# Patient Record
Sex: Female | Born: 1970
Health system: Southern US, Community
[De-identification: ages and names within clinical notes are randomized; demographics above are authoritative.]

## PROBLEM LIST (undated history)

## (undated) DIAGNOSIS — J387 Other diseases of larynx: Secondary | ICD-10-CM

## (undated) HISTORY — PX: ABDOMINAL HYSTERECTOMY: SHX81

## (undated) HISTORY — PX: LEEP: SHX91

## (undated) HISTORY — PX: DILATION AND CURETTAGE OF UTERUS: SHX78

## (undated) HISTORY — PX: APPENDECTOMY: SHX54

## (undated) HISTORY — PX: CHOLECYSTECTOMY: SHX55

---

## 2017-03-31 ENCOUNTER — Ambulatory Visit (INDEPENDENT_AMBULATORY_CARE_PROVIDER_SITE_OTHER): Payer: Managed Care, Other (non HMO) | Admitting: Otolaryngology

## 2017-03-31 DIAGNOSIS — D380 Neoplasm of uncertain behavior of larynx: Secondary | ICD-10-CM

## 2017-03-31 DIAGNOSIS — R490 Dysphonia: Secondary | ICD-10-CM

## 2017-04-01 ENCOUNTER — Other Ambulatory Visit: Payer: Self-pay | Admitting: Otolaryngology

## 2017-04-02 ENCOUNTER — Other Ambulatory Visit: Payer: Self-pay

## 2017-04-02 ENCOUNTER — Encounter (HOSPITAL_BASED_OUTPATIENT_CLINIC_OR_DEPARTMENT_OTHER): Payer: Self-pay | Admitting: *Deleted

## 2017-04-08 ENCOUNTER — Other Ambulatory Visit: Payer: Self-pay

## 2017-04-08 ENCOUNTER — Ambulatory Visit (HOSPITAL_BASED_OUTPATIENT_CLINIC_OR_DEPARTMENT_OTHER): Payer: Managed Care, Other (non HMO) | Admitting: Certified Registered"

## 2017-04-08 ENCOUNTER — Ambulatory Visit (HOSPITAL_BASED_OUTPATIENT_CLINIC_OR_DEPARTMENT_OTHER)
Admission: RE | Admit: 2017-04-08 | Discharge: 2017-04-08 | Disposition: A | Discharge status: Home / Self Care | Payer: Managed Care, Other (non HMO) | Source: Ambulatory Visit | Attending: Otolaryngology | Admitting: Otolaryngology

## 2017-04-08 ENCOUNTER — Encounter (HOSPITAL_BASED_OUTPATIENT_CLINIC_OR_DEPARTMENT_OTHER)
Admission: RE | Disposition: A | Discharge status: Home / Self Care | Payer: Self-pay | Source: Ambulatory Visit | Attending: Otolaryngology

## 2017-04-08 ENCOUNTER — Encounter (HOSPITAL_BASED_OUTPATIENT_CLINIC_OR_DEPARTMENT_OTHER): Payer: Self-pay

## 2017-04-08 DIAGNOSIS — J387 Other diseases of larynx: Secondary | ICD-10-CM | POA: Diagnosis present

## 2017-04-08 DIAGNOSIS — F17210 Nicotine dependence, cigarettes, uncomplicated: Secondary | ICD-10-CM | POA: Insufficient documentation

## 2017-04-08 DIAGNOSIS — Z880 Allergy status to penicillin: Secondary | ICD-10-CM | POA: Diagnosis not present

## 2017-04-08 DIAGNOSIS — J358 Other chronic diseases of tonsils and adenoids: Secondary | ICD-10-CM | POA: Insufficient documentation

## 2017-04-08 DIAGNOSIS — D380 Neoplasm of uncertain behavior of larynx: Secondary | ICD-10-CM | POA: Diagnosis not present

## 2017-04-08 DIAGNOSIS — Z882 Allergy status to sulfonamides status: Secondary | ICD-10-CM | POA: Diagnosis not present

## 2017-04-08 HISTORY — PX: LARYNGOSCOPY: SHX5203

## 2017-04-08 HISTORY — DX: Other diseases of larynx: J38.7

## 2017-04-08 SURGERY — LARYNGOSCOPY
Anesthesia: General | Site: Throat

## 2017-04-08 MED ORDER — OXYCODONE HCL 5 MG PO TABS: 5.0000 mg | ORAL_TABLET | Freq: Once | ORAL | Status: DC | PRN

## 2017-04-08 MED ORDER — FENTANYL CITRATE (PF) 100 MCG/2ML IJ SOLN: 25.0000 ug | INTRAMUSCULAR | Status: DC | PRN

## 2017-04-08 MED ORDER — CLINDAMYCIN HCL 300 MG PO CAPS: 300.0000 mg | ORAL_CAPSULE | Freq: Three times a day (TID) | ORAL | 0 refills | Status: AC

## 2017-04-08 MED ORDER — PROPOFOL 10 MG/ML IV BOLUS
INTRAVENOUS | Status: DC | PRN
  Administered 2017-04-08: 200 mg via INTRAVENOUS

## 2017-04-08 MED ORDER — LIDOCAINE HCL (CARDIAC) 20 MG/ML IV SOLN
INTRAVENOUS | Status: DC | PRN
  Administered 2017-04-08: 50 mg via INTRAVENOUS

## 2017-04-08 MED ORDER — PROPOFOL 10 MG/ML IV BOLUS
INTRAVENOUS | Status: AC
  Filled 2017-04-08: qty 40

## 2017-04-08 MED ORDER — ONDANSETRON HCL 4 MG/2ML IJ SOLN
INTRAMUSCULAR | Status: DC | PRN
  Administered 2017-04-08: 4 mg via INTRAVENOUS

## 2017-04-08 MED ORDER — ACETAMINOPHEN 325 MG PO TABS
ORAL_TABLET | ORAL | Status: AC
  Filled 2017-04-08: qty 2

## 2017-04-08 MED ORDER — LACTATED RINGERS IV SOLN
INTRAVENOUS | Status: DC
  Administered 2017-04-08: 08:00:00 via INTRAVENOUS

## 2017-04-08 MED ORDER — CLINDAMYCIN PHOSPHATE 600 MG/50ML IV SOLN
INTRAVENOUS | Status: DC | PRN
  Administered 2017-04-08: 600 mg via INTRAVENOUS

## 2017-04-08 MED ORDER — ACETAMINOPHEN 325 MG PO TABS
650.0000 mg | ORAL_TABLET | Freq: Once | ORAL | Status: AC
  Administered 2017-04-08: 650 mg via ORAL

## 2017-04-08 MED ORDER — PROMETHAZINE HCL 25 MG/ML IJ SOLN: 6.2500 mg | INTRAMUSCULAR | Status: DC | PRN

## 2017-04-08 MED ORDER — SUCCINYLCHOLINE CHLORIDE 20 MG/ML IJ SOLN
INTRAMUSCULAR | Status: DC | PRN
  Administered 2017-04-08: 30 mg via INTRAVENOUS

## 2017-04-08 MED ORDER — OXYCODONE HCL 5 MG/5ML PO SOLN: 5.0000 mg | Freq: Once | ORAL | Status: DC | PRN

## 2017-04-08 MED ORDER — FENTANYL CITRATE (PF) 100 MCG/2ML IJ SOLN
50.0000 ug | INTRAMUSCULAR | Status: DC | PRN
  Administered 2017-04-08 (×2): 50 ug via INTRAVENOUS

## 2017-04-08 MED ORDER — MIDAZOLAM HCL 2 MG/2ML IJ SOLN
INTRAMUSCULAR | Status: AC
  Filled 2017-04-08: qty 2

## 2017-04-08 MED ORDER — MIDAZOLAM HCL 2 MG/2ML IJ SOLN
1.0000 mg | INTRAMUSCULAR | Status: DC | PRN
  Administered 2017-04-08: 1 mg via INTRAVENOUS

## 2017-04-08 MED ORDER — DEXAMETHASONE SODIUM PHOSPHATE 4 MG/ML IJ SOLN
INTRAMUSCULAR | Status: DC | PRN
  Administered 2017-04-08: 20 mg via INTRAVENOUS

## 2017-04-08 MED ORDER — NEOSTIGMINE METHYLSULFATE 10 MG/10ML IV SOLN
INTRAVENOUS | Status: DC | PRN
  Administered 2017-04-08: 4 mg via INTRAVENOUS

## 2017-04-08 MED ORDER — MEPERIDINE HCL 25 MG/ML IJ SOLN: 6.2500 mg | INTRAMUSCULAR | Status: DC | PRN

## 2017-04-08 MED ORDER — GLYCOPYRROLATE 0.2 MG/ML IJ SOLN
INTRAMUSCULAR | Status: DC | PRN
  Administered 2017-04-08: 0.6 mg via INTRAVENOUS
  Administered 2017-04-08: 0.2 mg via INTRAVENOUS

## 2017-04-08 MED ORDER — FENTANYL CITRATE (PF) 100 MCG/2ML IJ SOLN
INTRAMUSCULAR | Status: AC
  Filled 2017-04-08: qty 2

## 2017-04-08 MED ORDER — EPINEPHRINE PF 1 MG/ML IJ SOLN
INTRAMUSCULAR | Status: DC | PRN
  Administered 2017-04-08: 1 mg

## 2017-04-08 MED ORDER — ROCURONIUM BROMIDE 100 MG/10ML IV SOLN
INTRAVENOUS | Status: DC | PRN
  Administered 2017-04-08: 30 mg via INTRAVENOUS

## 2017-04-08 MED ORDER — SCOPOLAMINE 1 MG/3DAYS TD PT72: 1.0000 | MEDICATED_PATCH | Freq: Once | TRANSDERMAL | Status: DC | PRN

## 2017-04-08 SURGICAL SUPPLY — 26 items
CANISTER SUCT 1200ML W/VALVE (MISCELLANEOUS) ×3 IMPLANT
GAUZE SPONGE 4X4 12PLY STRL LF (GAUZE/BANDAGES/DRESSINGS) ×6 IMPLANT
GLOVE BIO SURGEON STRL SZ 6.5 (GLOVE) ×4 IMPLANT
GLOVE BIO SURGEON STRL SZ7.5 (GLOVE) ×6 IMPLANT
GLOVE BIO SURGEONS STRL SZ 6.5 (GLOVE) ×2
GOWN STRL REUS W/ TWL LRG LVL3 (GOWN DISPOSABLE) IMPLANT
GOWN STRL REUS W/ TWL XL LVL3 (GOWN DISPOSABLE) ×3 IMPLANT
GOWN STRL REUS W/TWL LRG LVL3 (GOWN DISPOSABLE)
GOWN STRL REUS W/TWL XL LVL3 (GOWN DISPOSABLE) ×6
GUARD TEETH (MISCELLANEOUS) IMPLANT
MARKER SKIN DUAL TIP RULER LAB (MISCELLANEOUS) IMPLANT
NEEDLE HYPO 18GX1.5 BLUNT FILL (NEEDLE) IMPLANT
NEEDLE SPNL 22GX7 QUINCKE BK (NEEDLE) IMPLANT
NEEDLE SPNL 25GX3.5 QUINCKE BL (NEEDLE) ×3 IMPLANT
NS IRRIG 1000ML POUR BTL (IV SOLUTION) ×3 IMPLANT
PACK BASIN DAY SURGERY FS (CUSTOM PROCEDURE TRAY) ×3 IMPLANT
PATTIES SURGICAL .5 X3 (DISPOSABLE) ×3 IMPLANT
SHEET MEDIUM DRAPE 40X70 STRL (DRAPES) ×3 IMPLANT
SLEEVE SCD COMPRESS KNEE MED (MISCELLANEOUS) IMPLANT
SOLUTION BUTLER CLEAR DIP (MISCELLANEOUS) ×3 IMPLANT
SURGILUBE 2OZ TUBE FLIPTOP (MISCELLANEOUS) IMPLANT
SYR CONTROL 10ML LL (SYRINGE) ×3 IMPLANT
SYR TB 1ML LL NO SAFETY (SYRINGE) ×3 IMPLANT
TOWEL OR 17X24 6PK STRL BLUE (TOWEL DISPOSABLE) ×3 IMPLANT
TUBE CONNECTING 20'X1/4 (TUBING) ×1
TUBE CONNECTING 20X1/4 (TUBING) ×2 IMPLANT

## 2017-04-08 NOTE — Anesthesia Procedure Notes (Signed)
Procedure Name: Intubation Performed by: Verita Lamb, CRNA Pre-anesthesia Checklist: Patient identified, Emergency Drugs available, Suction available, Patient being monitored and Timeout performed Patient Re-evaluated:Patient Re-evaluated prior to induction Preoxygenation: Pre-oxygenation with 100% oxygen Induction Type: IV induction Ventilation: Mask ventilation without difficulty Laryngoscope Size: Mac, 3 and Glidescope Grade View: Grade I Tube type: Oral Tube size: 7.0 mm Number of attempts: 1 Airway Equipment and Method: Stylet Placement Confirmation: ETT inserted through vocal cords under direct vision,  CO2 detector,  positive ETCO2 and breath sounds checked- equal and bilateral Secured at: 21 cm Tube secured with: Tape Dental Injury: Teeth and Oropharynx as per pre-operative assessment

## 2017-04-08 NOTE — Transfer of Care (Signed)
Immediate Anesthesia Transfer of Care Note  Patient: Brandy Powell  Procedure(s) Performed: LARYNGOSCOPY WITH EXCISION OF LARYNGEAL MASS (N/A Throat)  Patient Location: PACU  Anesthesia Type:General  Level of Consciousness: awake, alert  and oriented  Airway & Oxygen Therapy: Patient Spontanous Breathing and Patient connected to face mask oxygen  Post-op Assessment: Report given to RN and Post -op Vital signs reviewed and stable  Post vital signs: Reviewed and stable  Last Vitals:  Vitals:   04/08/17 0757  BP: 114/65  Pulse: 79  Resp: 18  Temp: 36.4 C  SpO2: 99%    Last Pain:  Vitals:   04/08/17 0757  TempSrc: Oral         Complications: No apparent anesthesia complications

## 2017-04-08 NOTE — H&P (Signed)
Cc: Laryngeal cyst  HPI: The patient is a 47 y/o female who presents today for evaluation of a laryngeal cyst. The patient is seen in consultation requested by [facility]. The patient recently underwent a gynecological procedure with a laryngeal cyst noted at intubation. The patient has noted intermittent hoarseness since that time. No dysphagia or odynophagia is noted. The patient is a 30+ pack year smoker. No previous ENT surgery is noted.   The patient's review of systems (constitutional, eyes, ENT, cardiovascular, respiratory, GI, musculoskeletal, skin, neurologic, psychiatric, endocrine, hematologic, allergic) is noted in the ROS questionnaire.  It is reviewed with the patient.   Family health history: Diabetes, heart disease.  Major events: Hysterectomy, appendectomy, gallbladder removed, D&C.  Ongoing medical problems: None.  Social history: The patient is single. She denies the use of alcohol or illegal drugs. She smoke 1-1.5 pack of cigarettes a day.  Exam General: Communicates without difficulty, well nourished, no acute distress. Head: Normocephalic, no evidence injury, no tenderness, facial buttresses intact without stepoff. Eyes: PERRL, EOMI.  No scleral icterus, conjunctivae clear. Ears: External auditory canals clear bilaterally.  There is no edema or erythema.  Tympanic membrane is within normal limits bilaterally. Nose: Normal skin and external support.  Anterior rhinoscopy reveals healthy pink mucosa over the septum and turbinates.  No lesions or polyps were seen. Oral cavity: Lips without lesions, oral mucosa moist, no masses or lesions seen. Indirect  mirror laryngoscopy could not be tolerated. Pharynx: Clear, no erythema. Neck: Supple, full range of motion, no lymphadenopathy, no masses palpable. Salivary: Parotid and submandibular glands without mass. Neuro:  CN 2-12 grossly intact. Gait normal. Vestibular: No nystagmus at any point of gaze.   Procedure:  Flexible  Fiberoptic Laryngoscopy -- Risks, benefits, and alternatives of flexible endoscopy were explained to the patient.  Specific mention was made of the risk of throat numbness with difficulty swallowing, possible bleeding from the nose and mouth, and pain from the procedure.  The patient gave oral consent to proceed.  The nasal cavities were decongested and anesthetised with a combination of oxymetazoline and 4% lidocaine solution.  The flexible scope was inserted into the right nasal cavity and advanced towards the nasopharynx.  Visualized mucosa over the turbinates and septum were as described above.  The nasopharynx was clear.  Oropharyngeal walls were symmetric and mobile without lesion, mass, or edema.  Hypopharynx was also without  lesion or edema.  Larynx was mobile. A cystic lesion noted at the right aryepiglottic fold.  Supraglottic structures were free of edema, mass, and asymmetry.  True vocal folds were white without mass or lesion.  Base of tongue was within normal limits.  The patient tolerated the procedure well.  Assessment A cystic lesion is noted at the right aryepiglottic fold.  The appearance is suggestive of a benign lesion. No other suspicious mass or lesion is noted on today's fiberoptic laryngoscopy exam.  Plan  1. Laryngoscopy findings are reviewed with the patient.  2. Recommend DL with excision of the mass. The risks, benefits, alternatives, and details of the procedure are reviewed with the patient. Questions are invited and answered. 3. The patient is interested in proceeding with the procedure.  We will schedule the procedure in accordance with the family schedule.

## 2017-04-08 NOTE — Anesthesia Postprocedure Evaluation (Signed)
Anesthesia Post Note  Patient: Brandy Powell  Procedure(s) Performed: LARYNGOSCOPY WITH EXCISION OF LARYNGEAL MASS (N/A Throat)     Patient location during evaluation: PACU Anesthesia Type: General Level of consciousness: sedated and patient cooperative Pain management: pain level controlled Vital Signs Assessment: post-procedure vital signs reviewed and stable Respiratory status: spontaneous breathing Cardiovascular status: stable Anesthetic complications: no    Last Vitals:  Vitals:   04/08/17 1102 04/08/17 1125  BP:  138/85  Pulse:    Resp: (!) 22 20  Temp:  37 C  SpO2:  98%    Last Pain:  Vitals:   04/08/17 1125  TempSrc:   PainSc: Ali Chuk

## 2017-04-08 NOTE — Anesthesia Preprocedure Evaluation (Signed)
Anesthesia Evaluation  Patient identified by MRN, date of birth, ID band Patient awake    Reviewed: Allergy & Precautions, NPO status , Patient's Chart, lab work & pertinent test results  Airway Mallampati: II  TM Distance: >3 FB Neck ROM: Full    Dental no notable dental hx.    Pulmonary neg pulmonary ROS, Current Smoker,    Pulmonary exam normal breath sounds clear to auscultation       Cardiovascular negative cardio ROS Normal cardiovascular exam Rhythm:Regular Rate:Normal     Neuro/Psych negative neurological ROS  negative psych ROS   GI/Hepatic negative GI ROS, Neg liver ROS,   Endo/Other  negative endocrine ROS  Renal/GU negative Renal ROS     Musculoskeletal negative musculoskeletal ROS (+)   Abdominal   Peds  Hematology negative hematology ROS (+)   Anesthesia Other Findings   Reproductive/Obstetrics negative OB ROS                             Anesthesia Physical Anesthesia Plan  ASA: II  Anesthesia Plan: General   Post-op Pain Management:    Induction: Intravenous  PONV Risk Score and Plan: 2 and Ondansetron and Dexamethasone  Airway Management Planned: Oral ETT  Additional Equipment:   Intra-op Plan:   Post-operative Plan: Extubation in OR  Informed Consent: I have reviewed the patients History and Physical, chart, labs and discussed the procedure including the risks, benefits and alternatives for the proposed anesthesia with the patient or authorized representative who has indicated his/her understanding and acceptance.   Dental advisory given  Plan Discussed with: CRNA  Anesthesia Plan Comments:         Anesthesia Quick Evaluation

## 2017-04-08 NOTE — Discharge Instructions (Addendum)
°  Post Anesthesia Home Care Instructions  Activity: Get plenty of rest for the remainder of the day. A responsible individual must stay with you for 24 hours following the procedure.  For the next 24 hours, DO NOT: -Drive a car -Paediatric nurse -Drink alcoholic beverages -Take any medication unless instructed by your physician -Make any legal decisions or sign important papers.  Meals: Start with liquid foods such as gelatin or soup. Progress to regular foods as tolerated. Avoid greasy, spicy, heavy foods. If nausea and/or vomiting occur, drink only clear liquids until the nausea and/or vomiting subsides. Call your physician if vomiting continues.  Special Instructions/Symptoms: Your throat may feel dry or sore from the anesthesia or the breathing tube placed in your throat during surgery. If this causes discomfort, gargle with warm salt water. The discomfort should disappear within 24 hours.  If you had a scopolamine patch placed behind your ear for the management of post- operative nausea and/or vomiting:  1. The medication in the patch is effective for 72 hours, after which it should be removed.  Wrap patch in a tissue and discard in the trash. Wash hands thoroughly with soap and water. 2. You may remove the patch earlier than 72 hours if you experience unpleasant side effects which may include dry mouth, dizziness or visual disturbances. 3. Avoid touching the patch. Wash your hands with soap and water after contact with the patch.   ----------------------  The patient may resume all her previous activities and diet. She will follow-up in my [facility] office in one week.

## 2017-04-08 NOTE — Op Note (Signed)
DATE OF PROCEDURE:  04/08/2017                              OPERATIVE REPORT  SURGEON:  Leta Baptist, MD  PREOPERATIVE DIAGNOSES: 1. Right supraglottic mass  POSTOPERATIVE DIAGNOSES: 1. Right supraglottic mass  PROCEDURE PERFORMED:  MicroDirect laryngoscopy with excision of right supraglottic mass (CPT 31541)  ANESTHESIA:  General endotracheal tube anesthesia.  COMPLICATIONS:  None.  ESTIMATED BLOOD LOSS:  Minimal.  INDICATION FOR PROCEDURE:  Brandy Powell is a 47 y.o. female who was recently noted to have a laryngeal mass during a gynecological procedure.  On her flexible laryngoscopy examination, a 1 cm cystic lesion was noted at the right aryepiglottic fold. Based on the above findings, the decision was made for the patient to undergo the above-stated procedure.  The risks, benefits, alternatives, and details of the procedure were discussed with the patient.  Questions were invited and answered.  Informed consent was obtained.  DESCRIPTION:  The patient was taken to the operating room and placed supine on the operating table.  General endotracheal tube anesthesia was administered by the anesthesiologist.  The patient was positioned and prepped and draped in a standard fashion for the right laryngoscopy.  A Dedo laryngoscope was inserted via the oral cavity into the pharynx. A large 1 cm cystic lesion was noted above the right aryepiglottic fold. The vallecula, epiglottis, piriform sinuses, and the vocal cords were otherwise normal. The Dedo laryngoscope was suspended with a Lewy suspender. A microscope was brought into the field. Under the all operating microscope, the laryngeal lesion was excised using a laryngeal cup forceps and laryngeal scissors. Hemostasis was achieved with pledgets soaked with epinephrine.  The care of the patient was turned over to the anesthesiologist.  The patient was awakened from anesthesia without difficulty.  The patient was extubated and transferred to the  recovery room in good condition.  OPERATIVE FINDINGS: A 1 cm right aryepiglottic fold mass.  SPECIMEN: Right aryepiglottic fold mass.  FOLLOWUP CARE:  The patient will be discharged home once awake and alert.  The patient will follow up in my office in 1 week.  Cindel Daugherty W Zani Kyllonen 04/08/2017 10:00 AM

## 2017-04-09 ENCOUNTER — Encounter (HOSPITAL_BASED_OUTPATIENT_CLINIC_OR_DEPARTMENT_OTHER): Payer: Self-pay | Admitting: Otolaryngology

## 2017-04-17 ENCOUNTER — Ambulatory Visit (INDEPENDENT_AMBULATORY_CARE_PROVIDER_SITE_OTHER): Payer: Managed Care, Other (non HMO) | Admitting: Otolaryngology

## 2017-04-21 ENCOUNTER — Ambulatory Visit (INDEPENDENT_AMBULATORY_CARE_PROVIDER_SITE_OTHER): Payer: Managed Care, Other (non HMO) | Admitting: Otolaryngology

## 2017-05-08 ENCOUNTER — Ambulatory Visit (INDEPENDENT_AMBULATORY_CARE_PROVIDER_SITE_OTHER): Payer: Managed Care, Other (non HMO) | Admitting: Otolaryngology

## 2017-05-08 DIAGNOSIS — D141 Benign neoplasm of larynx: Secondary | ICD-10-CM

## 2017-11-06 ENCOUNTER — Ambulatory Visit (INDEPENDENT_AMBULATORY_CARE_PROVIDER_SITE_OTHER): Payer: Managed Care, Other (non HMO) | Admitting: Otolaryngology

## 2020-03-22 ENCOUNTER — Other Ambulatory Visit: Payer: Self-pay | Admitting: Physician Assistant

## 2020-03-22 DIAGNOSIS — R921 Mammographic calcification found on diagnostic imaging of breast: Secondary | ICD-10-CM

## 2020-03-28 ENCOUNTER — Other Ambulatory Visit: Payer: Self-pay

## 2020-03-28 ENCOUNTER — Ambulatory Visit
Admission: RE | Admit: 2020-03-28 | Discharge: 2020-03-28 | Disposition: A | Discharge status: Home / Self Care | Payer: 59 | Source: Ambulatory Visit | Attending: Physician Assistant | Admitting: Physician Assistant

## 2020-03-28 DIAGNOSIS — R921 Mammographic calcification found on diagnostic imaging of breast: Secondary | ICD-10-CM

## 2021-10-03 IMAGING — MG MM BREAST BX W LOC DEV 1ST LESION IMAGE BX SPEC STEREO GUIDE*L*
7 of 13 series · 7 of 21 positions shown · non-contrast
Comparison: Previous exams.
COMPARISON: Previous exams.

Addendum:
CLINICAL DATA: Patient with 2 separate groups of indeterminate
calcifications in the upper LEFT breast presents today for
ultrasound-guided core biopsy

EXAM:
LEFT BREAST STEREOTACTIC CORE NEEDLE BIOPSY x2

[L (1 of 7)]
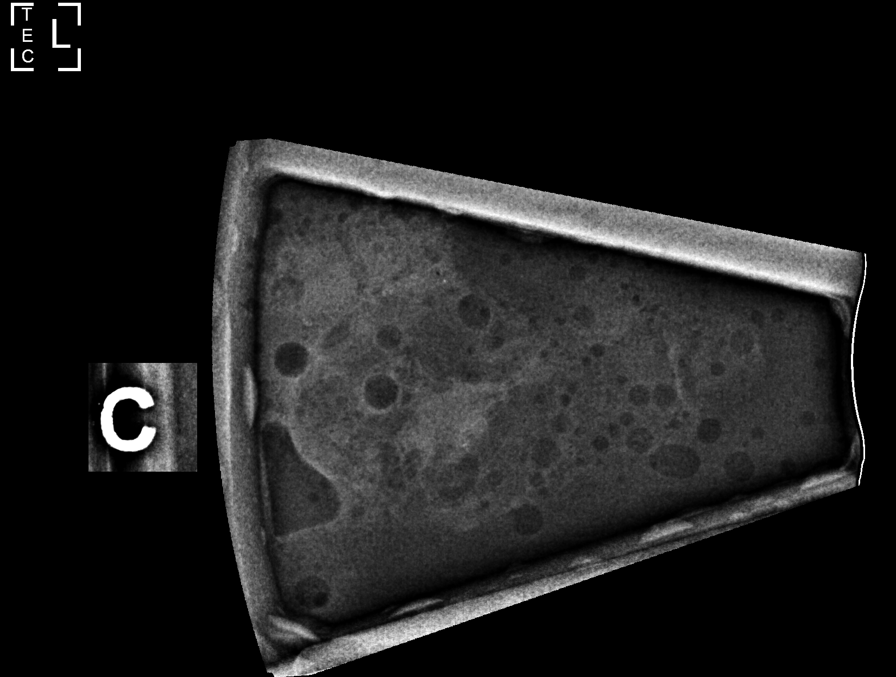

[L (2 of 7)]
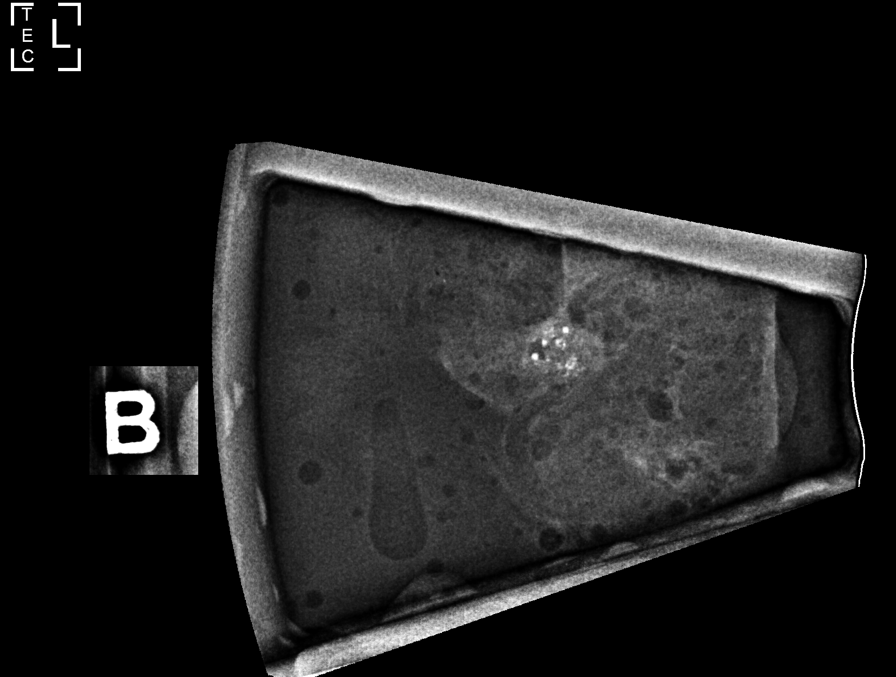

[L (3 of 7)]
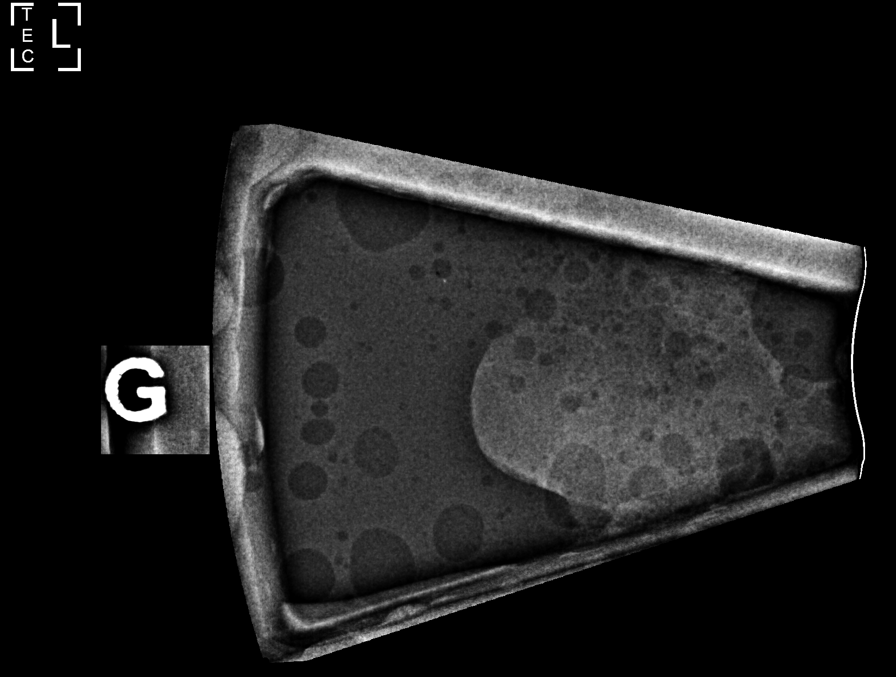

[L (4 of 7)]
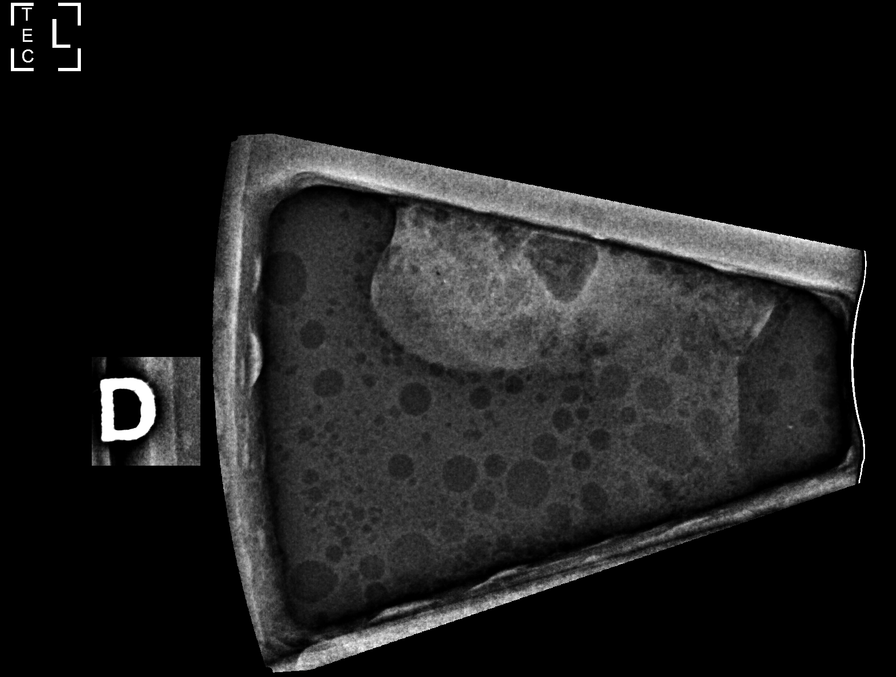

[L (5 of 7)]
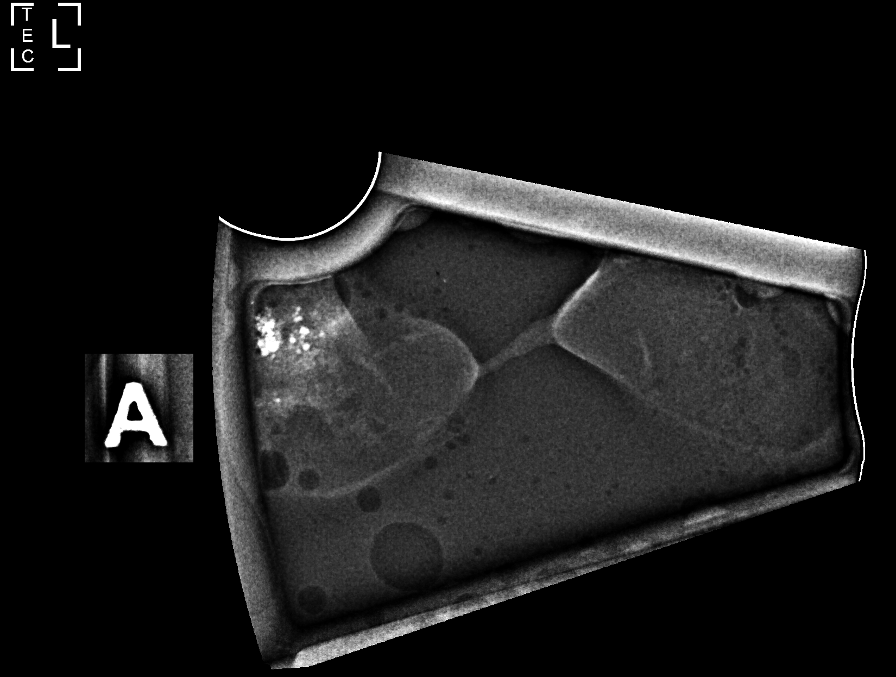

[L (6 of 7)]
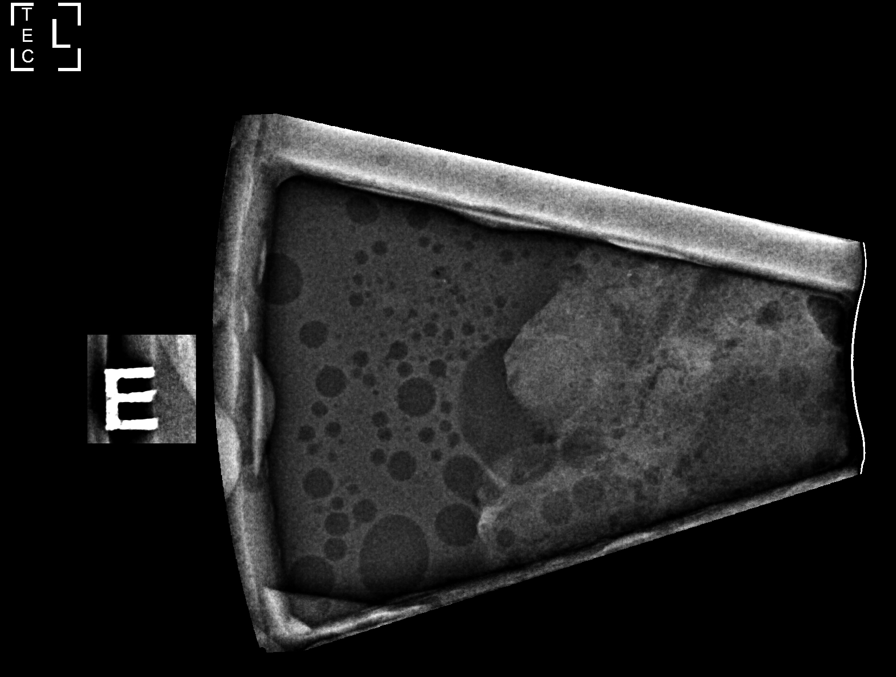

[L (7 of 7)]
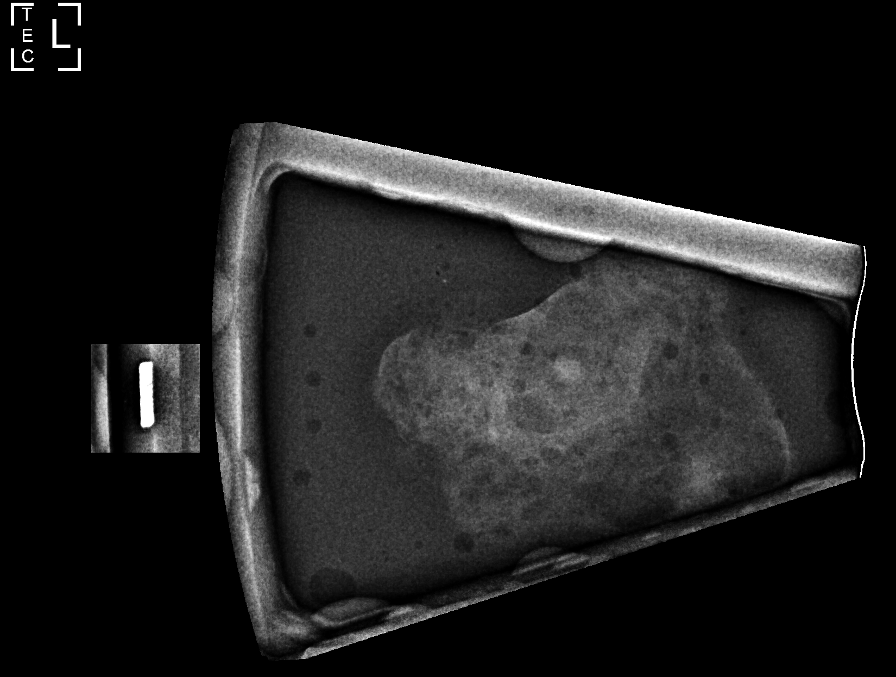

[7 of 21 positions shown; findings below may reference images not displayed]



Site 1:

Using sterile technique and 1% Lidocaine as local anesthetic, under
stereotactic guidance, a 9 gauge vacuum assisted device was used to
perform core needle biopsy of calcifications in the upper-outer
quadrant of the left breast using a superior approach. Specimen
radiograph was performed showing calcifications. Specimens with
calcifications are identified for pathology.

Lesion quadrant: Upper outer quadrant

At the conclusion of the procedure, coil shaped tissue marker clip
was deployed into the biopsy cavity.

Site 2:

Using sterile technique and 1% Lidocaine as local anesthetic, under
stereotactic guidance, a 9 gauge vacuum assisted device was used to
perform core needle biopsy of calcifications in the upper inner
quadrant of the left breast using a superior approach. Specimen
radiograph was performed showing calcifications. Specimens with
calcifications are identified for pathology.

Lesion quadrant: Upper inner quadrant

At the conclusion of the procedure, X shaped tissue marker clip was
deployed into the biopsy cavity.

Follow-up 2-view mammogram was performed and dictated separately.
IMPRESSION: 1. Stereotactic-guided biopsy of calcifications within the
upper-outer quadrant of the LEFT breast (coil shaped clip). No
apparent complications.
2. Stereotactic-guided biopsy of calcifications within the upper
inner quadrant of the LEFT breast (X shaped clip). No apparent
complications.

ADDENDUM:
Pathology revealed USUAL DUCTAL HYPERPLASIA, COLUMNAR CELL CHANGES
AND SCLEROSING ADENOSIS WITH CALCIFICATIONS of the LEFT breast,
upper outer. This was found to be concordant by Dr. Chayo Mccrea.

Pathology revealed USUAL DUCTAL HYPERPLASIA, COLUMNAR CELL CHANGES
AND SCLEROSING ADENOSIS WITH CALCIFICATIONS of the LEFT breast,
upper inner. This was found to be concordant by Dr. Chayo Mccrea.

Pathology results were discussed with the patient by telephone. The
patient reported doing well after the biopsies with tenderness at
the sites. Post biopsy instructions and care were reviewed and
questions were answered. The patient was encouraged to call The

The patient was instructed to return for annual screening
mammography at [HOSPITAL] [REDACTED] and informed a
reminder notice would be sent regarding this appointment.

Pathology results reported by Ribet Herrmann RN on 03/29/2020.



Site 1:

Using sterile technique and 1% Lidocaine as local anesthetic, under
stereotactic guidance, a 9 gauge vacuum assisted device was used to
perform core needle biopsy of calcifications in the upper-outer
quadrant of the left breast using a superior approach. Specimen
radiograph was performed showing calcifications. Specimens with
calcifications are identified for pathology.

Lesion quadrant: Upper outer quadrant

At the conclusion of the procedure, coil shaped tissue marker clip
was deployed into the biopsy cavity.

Site 2:

Using sterile technique and 1% Lidocaine as local anesthetic, under
stereotactic guidance, a 9 gauge vacuum assisted device was used to
perform core needle biopsy of calcifications in the upper inner
quadrant of the left breast using a superior approach. Specimen
radiograph was performed showing calcifications. Specimens with
calcifications are identified for pathology.

Lesion quadrant: Upper inner quadrant

At the conclusion of the procedure, X shaped tissue marker clip was
deployed into the biopsy cavity.

Follow-up 2-view mammogram was performed and dictated separately.
IMPRESSION: 1. Stereotactic-guided biopsy of calcifications within the
upper-outer quadrant of the LEFT breast (coil shaped clip). No
apparent complications.
2. Stereotactic-guided biopsy of calcifications within the upper
inner quadrant of the LEFT breast (X shaped clip). No apparent
complications.

## 2021-10-03 IMAGING — MG MM BREAST BX W LOC DEV EA AD LESION IMG BX SPEC STEREO GUIDE*L*
6 series · 7 of 22 positions shown · non-contrast
Comparison: Previous exams.
COMPARISON: Previous exams.

Addendum:
CLINICAL DATA: Patient with 2 separate groups of indeterminate
calcifications in the upper LEFT breast presents today for
ultrasound-guided core biopsy

EXAM:
LEFT BREAST STEREOTACTIC CORE NEEDLE BIOPSY x2

[L CC (1 of 2)]
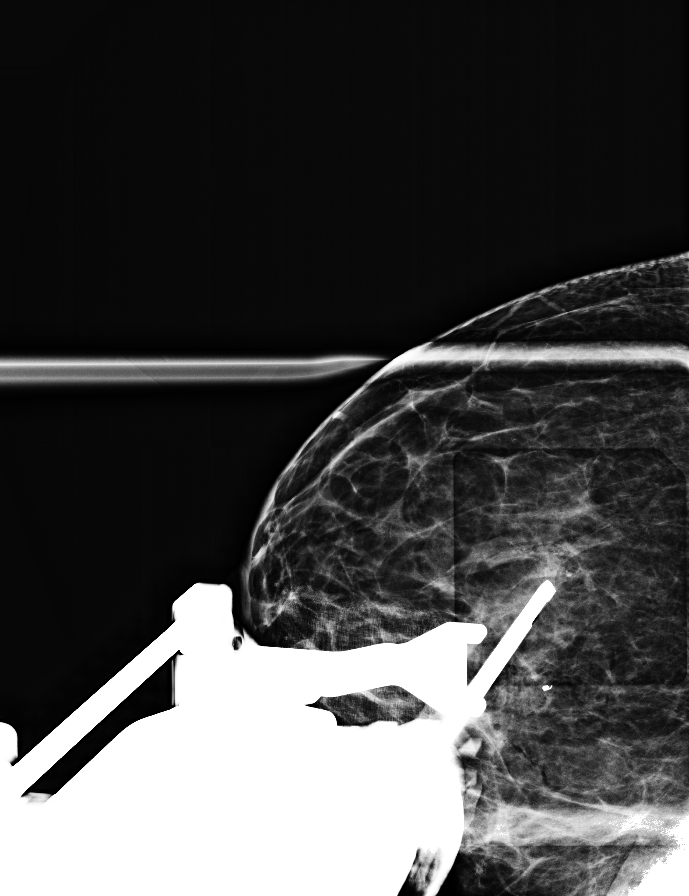

[L CC (2 of 2)]
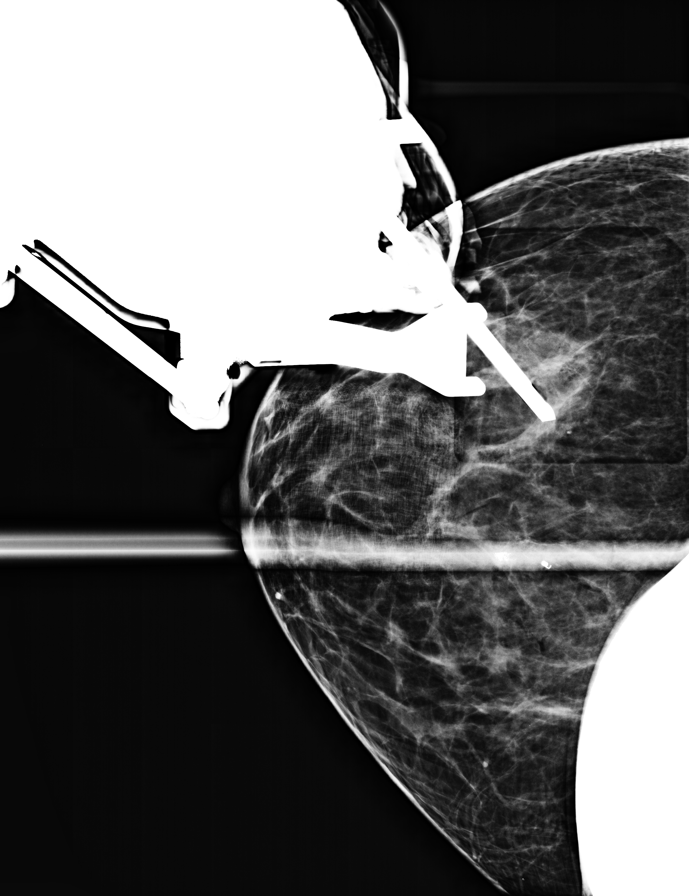

[L CC tomo · 2 of 75 frames shown (1 of 4)]
[frame 25/75]
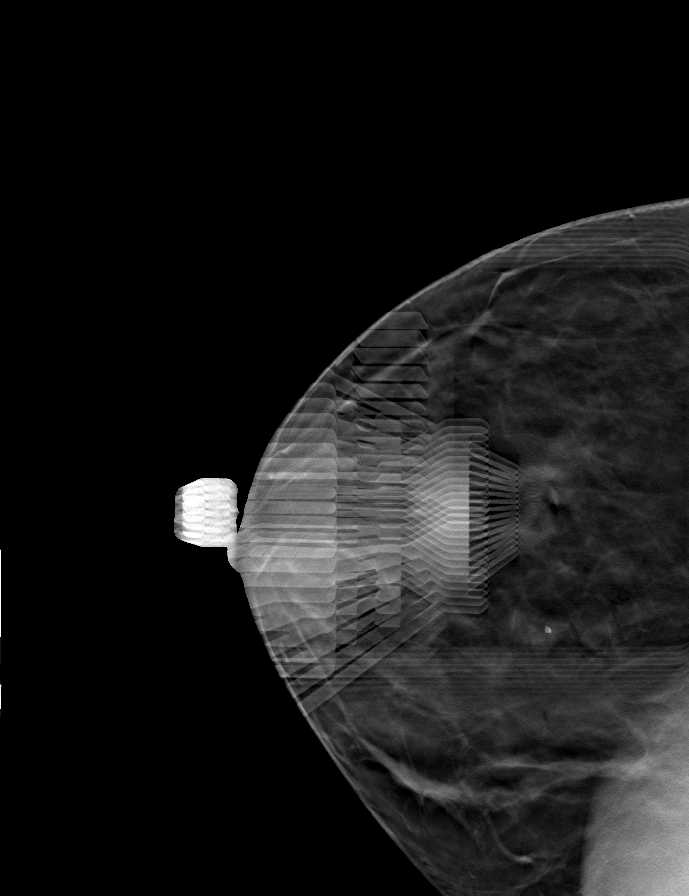
[frame 38/75]
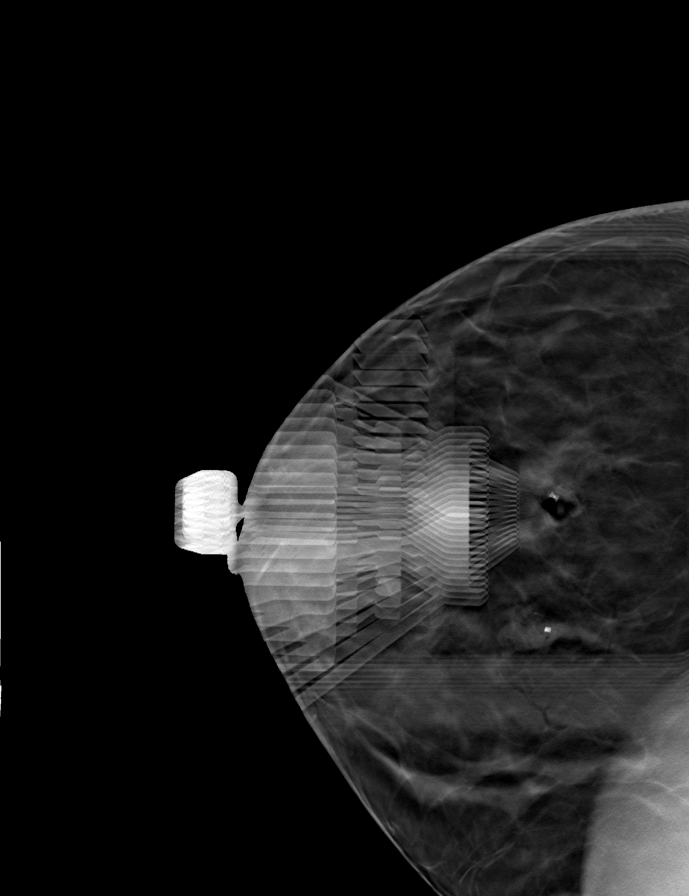

[L CC tomo (2 of 4) · tomo slice 38/75.0]
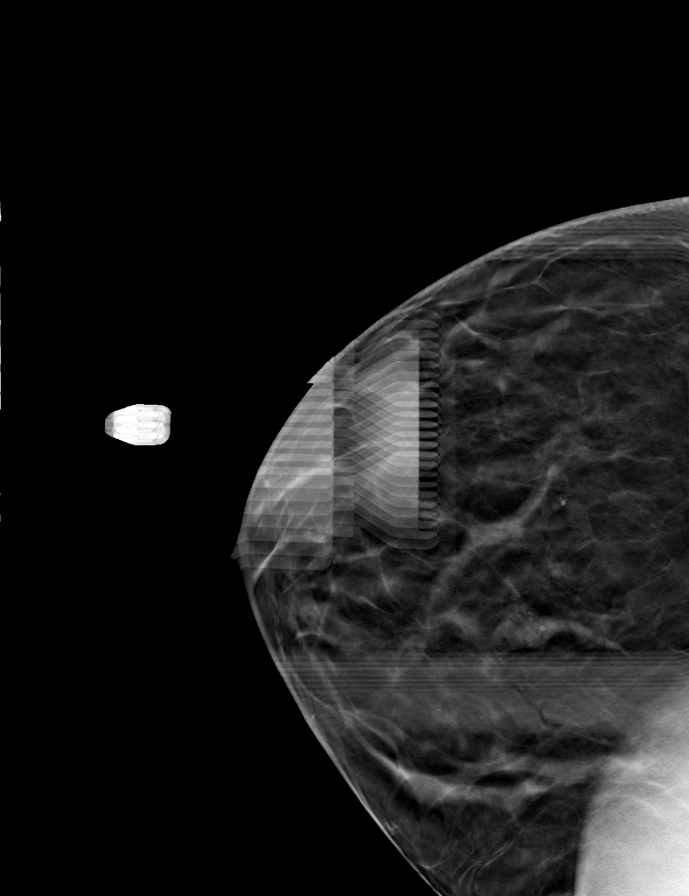

[L CC tomo (3 of 4) · tomo slice 32/63.0]
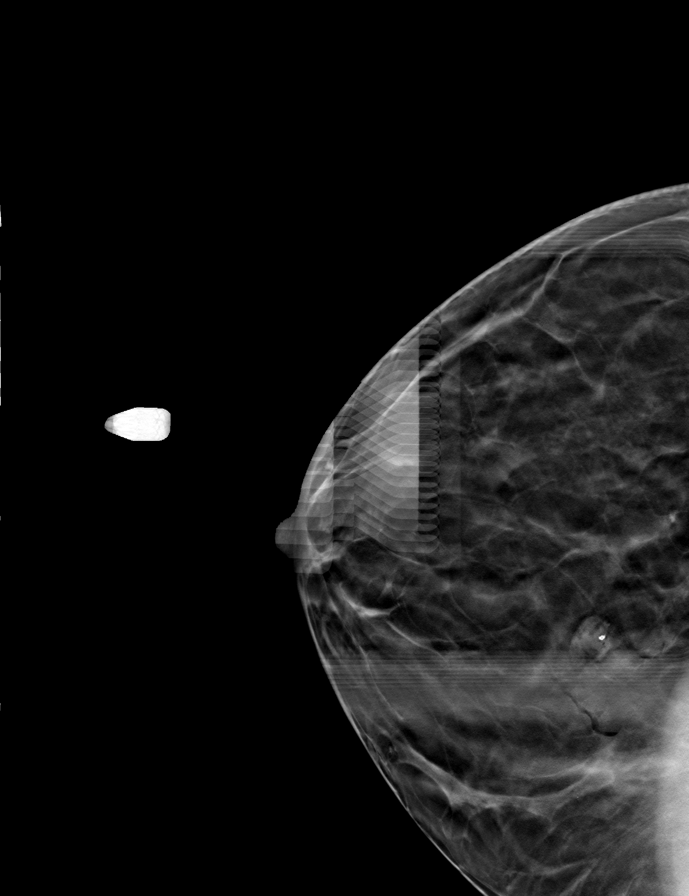

[L CC tomo (4 of 4) · tomo slice 38/75.0]
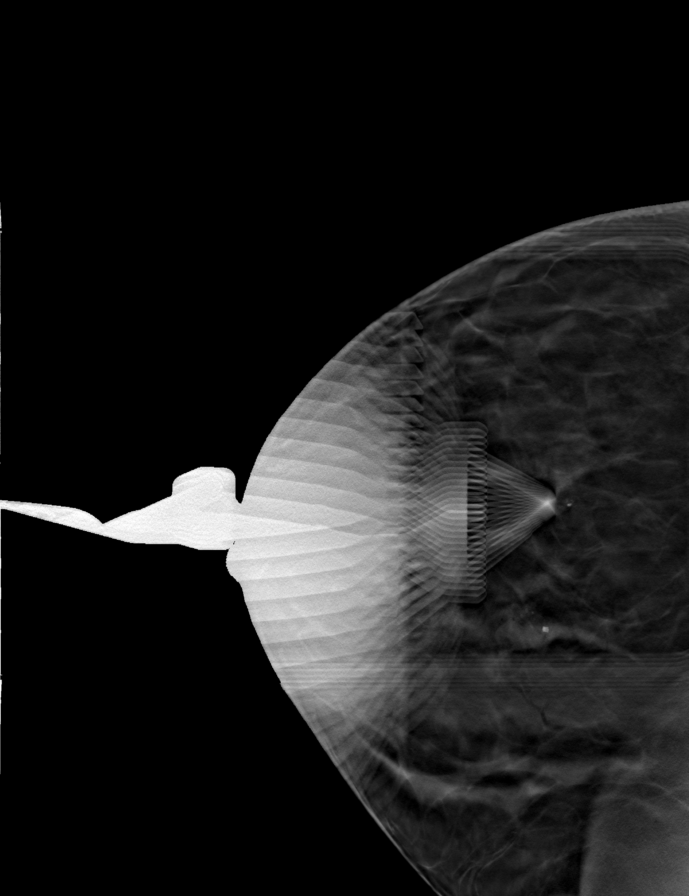

[7 of 22 positions shown; findings below may reference images not displayed]



Site 1:

Using sterile technique and 1% Lidocaine as local anesthetic, under
stereotactic guidance, a 9 gauge vacuum assisted device was used to
perform core needle biopsy of calcifications in the upper-outer
quadrant of the left breast using a superior approach. Specimen
radiograph was performed showing calcifications. Specimens with
calcifications are identified for pathology.

Lesion quadrant: Upper outer quadrant

At the conclusion of the procedure, coil shaped tissue marker clip
was deployed into the biopsy cavity.

Site 2:

Using sterile technique and 1% Lidocaine as local anesthetic, under
stereotactic guidance, a 9 gauge vacuum assisted device was used to
perform core needle biopsy of calcifications in the upper inner
quadrant of the left breast using a superior approach. Specimen
radiograph was performed showing calcifications. Specimens with
calcifications are identified for pathology.

Lesion quadrant: Upper inner quadrant

At the conclusion of the procedure, X shaped tissue marker clip was
deployed into the biopsy cavity.

Follow-up 2-view mammogram was performed and dictated separately.
IMPRESSION: 1. Stereotactic-guided biopsy of calcifications within the
upper-outer quadrant of the LEFT breast (coil shaped clip). No
apparent complications.
2. Stereotactic-guided biopsy of calcifications within the upper
inner quadrant of the LEFT breast (X shaped clip). No apparent
complications.

ADDENDUM:
Pathology revealed USUAL DUCTAL HYPERPLASIA, COLUMNAR CELL CHANGES
AND SCLEROSING ADENOSIS WITH CALCIFICATIONS of the LEFT breast,
upper outer. This was found to be concordant by Dr. Chayo Mccrea.

Pathology revealed USUAL DUCTAL HYPERPLASIA, COLUMNAR CELL CHANGES
AND SCLEROSING ADENOSIS WITH CALCIFICATIONS of the LEFT breast,
upper inner. This was found to be concordant by Dr. Chayo Mccrea.

Pathology results were discussed with the patient by telephone. The
patient reported doing well after the biopsies with tenderness at
the sites. Post biopsy instructions and care were reviewed and
questions were answered. The patient was encouraged to call The

The patient was instructed to return for annual screening
mammography at [HOSPITAL] [REDACTED] and informed a
reminder notice would be sent regarding this appointment.

Pathology results reported by Ribet Herrmann RN on 03/29/2020.



Site 1:

Using sterile technique and 1% Lidocaine as local anesthetic, under
stereotactic guidance, a 9 gauge vacuum assisted device was used to
perform core needle biopsy of calcifications in the upper-outer
quadrant of the left breast using a superior approach. Specimen
radiograph was performed showing calcifications. Specimens with
calcifications are identified for pathology.

Lesion quadrant: Upper outer quadrant

At the conclusion of the procedure, coil shaped tissue marker clip
was deployed into the biopsy cavity.

Site 2:

Using sterile technique and 1% Lidocaine as local anesthetic, under
stereotactic guidance, a 9 gauge vacuum assisted device was used to
perform core needle biopsy of calcifications in the upper inner
quadrant of the left breast using a superior approach. Specimen
radiograph was performed showing calcifications. Specimens with
calcifications are identified for pathology.

Lesion quadrant: Upper inner quadrant

At the conclusion of the procedure, X shaped tissue marker clip was
deployed into the biopsy cavity.

Follow-up 2-view mammogram was performed and dictated separately.
IMPRESSION: 1. Stereotactic-guided biopsy of calcifications within the
upper-outer quadrant of the LEFT breast (coil shaped clip). No
apparent complications.
2. Stereotactic-guided biopsy of calcifications within the upper
inner quadrant of the LEFT breast (X shaped clip). No apparent
complications.
